# Patient Record
Sex: Female | Born: 1975 | Race: White | Hispanic: No | Marital: Married | Smoking: Never smoker
Health system: Southern US, Community
[De-identification: ages and names within clinical notes are randomized; demographics above are authoritative.]

## PROBLEM LIST (undated history)

## (undated) DIAGNOSIS — N289 Disorder of kidney and ureter, unspecified: Secondary | ICD-10-CM

## (undated) DIAGNOSIS — Z86718 Personal history of other venous thrombosis and embolism: Secondary | ICD-10-CM

## (undated) HISTORY — PX: GASTRIC BYPASS: SHX52

## (undated) HISTORY — PX: CHOLECYSTECTOMY: SHX55

---

## 2017-11-07 ENCOUNTER — Encounter (HOSPITAL_BASED_OUTPATIENT_CLINIC_OR_DEPARTMENT_OTHER): Payer: Self-pay | Admitting: *Deleted

## 2017-11-07 ENCOUNTER — Other Ambulatory Visit: Payer: Self-pay

## 2017-11-07 DIAGNOSIS — N390 Urinary tract infection, site not specified: Secondary | ICD-10-CM | POA: Insufficient documentation

## 2017-11-07 DIAGNOSIS — R319 Hematuria, unspecified: Secondary | ICD-10-CM | POA: Diagnosis present

## 2017-11-07 LAB — URINALYSIS, ROUTINE W REFLEX MICROSCOPIC
BILIRUBIN URINE: NEGATIVE
GLUCOSE, UA: NEGATIVE mg/dL
KETONES UR: 15 mg/dL — AB
Nitrite: NEGATIVE
PH: 6 (ref 5.0–8.0)
PROTEIN: NEGATIVE mg/dL
Specific Gravity, Urine: >1.03 — ABNORMAL HIGH (ref 1.005–1.030)

## 2017-11-07 LAB — URINALYSIS, MICROSCOPIC (REFLEX)

## 2017-11-07 LAB — PREGNANCY, URINE: Preg Test, Ur: NEGATIVE

## 2017-11-07 NOTE — ED Triage Notes (Signed)
Hematuria. Lower back pain in the center of her back and lower abdominal cramps in the center of her abdomen earlier this am.

## 2017-11-08 ENCOUNTER — Emergency Department (HOSPITAL_BASED_OUTPATIENT_CLINIC_OR_DEPARTMENT_OTHER): Payer: Medicare HMO

## 2017-11-08 ENCOUNTER — Emergency Department (HOSPITAL_BASED_OUTPATIENT_CLINIC_OR_DEPARTMENT_OTHER)
Admission: EM | Admit: 2017-11-08 | Discharge: 2017-11-08 | Disposition: A | Discharge status: Home / Self Care | Payer: Medicare HMO | Attending: Emergency Medicine | Admitting: Emergency Medicine

## 2017-11-08 DIAGNOSIS — N390 Urinary tract infection, site not specified: Secondary | ICD-10-CM

## 2017-11-08 HISTORY — DX: Disorder of kidney and ureter, unspecified: N28.9

## 2017-11-08 HISTORY — DX: Personal history of other venous thrombosis and embolism: Z86.718

## 2017-11-08 LAB — CBC WITH DIFFERENTIAL/PLATELET
Basophils Absolute: 0.1 10*3/uL (ref 0.0–0.1)
Basophils Relative: 1 %
EOS PCT: 4 %
Eosinophils Absolute: 0.4 10*3/uL (ref 0.0–0.7)
HEMATOCRIT: 37.8 % (ref 36.0–46.0)
Hemoglobin: 12.5 g/dL (ref 12.0–15.0)
LYMPHS PCT: 25 %
Lymphs Abs: 2.2 10*3/uL (ref 0.7–4.0)
MCH: 27.5 pg (ref 26.0–34.0)
MCHC: 33.1 g/dL (ref 30.0–36.0)
MCV: 83.1 fL (ref 78.0–100.0)
MONO ABS: 0.6 10*3/uL (ref 0.1–1.0)
Monocytes Relative: 7 %
NEUTROS ABS: 5.5 10*3/uL (ref 1.7–7.7)
Neutrophils Relative %: 63 %
PLATELETS: 234 10*3/uL (ref 150–400)
RBC: 4.55 MIL/uL (ref 3.87–5.11)
RDW: 14.2 % (ref 11.5–15.5)
WBC: 8.8 10*3/uL (ref 4.0–10.5)

## 2017-11-08 LAB — COMPREHENSIVE METABOLIC PANEL
ALBUMIN: 4.3 g/dL (ref 3.5–5.0)
ALK PHOS: 53 U/L (ref 38–126)
ALT: 18 U/L (ref 14–54)
AST: 18 U/L (ref 15–41)
Anion gap: 5 (ref 5–15)
BUN: 19 mg/dL (ref 6–20)
CALCIUM: 9 mg/dL (ref 8.9–10.3)
CHLORIDE: 104 mmol/L (ref 101–111)
CO2: 27 mmol/L (ref 22–32)
CREATININE: 1.01 mg/dL — AB (ref 0.44–1.00)
GFR calc Af Amer: >60 mL/min (ref >60)
GFR calc non Af Amer: >60 mL/min (ref >60)
GLUCOSE: 99 mg/dL (ref 65–99)
Potassium: 3.6 mmol/L (ref 3.5–5.1)
SODIUM: 136 mmol/L (ref 135–145)
Total Bilirubin: 0.9 mg/dL (ref 0.3–1.2)
Total Protein: 6.9 g/dL (ref 6.5–8.1)

## 2017-11-08 MED ORDER — TRAMADOL HCL 50 MG PO TABS: 50.0000 mg | ORAL_TABLET | Freq: Four times a day (QID) | ORAL | 0 refills | Status: AC | PRN

## 2017-11-08 MED ORDER — FENTANYL CITRATE (PF) 100 MCG/2ML IJ SOLN
50.0000 ug | Freq: Once | INTRAMUSCULAR | Status: AC
Start: 2017-11-08 — End: 2017-11-08
  Administered 2017-11-08: 50 ug via INTRAVENOUS
  Filled 2017-11-08: qty 2

## 2017-11-08 MED ORDER — ONDANSETRON 4 MG PO TBDP: 4.0000 mg | ORAL_TABLET | Freq: Four times a day (QID) | ORAL | 0 refills | Status: AC | PRN

## 2017-11-08 MED ORDER — SODIUM CHLORIDE 0.9 % IV BOLUS (SEPSIS)
1000.0000 mL | Freq: Once | INTRAVENOUS | Status: AC
  Administered 2017-11-08: 1000 mL via INTRAVENOUS

## 2017-11-08 MED ORDER — FENTANYL CITRATE (PF) 100 MCG/2ML IJ SOLN
INTRAMUSCULAR | Status: AC
  Filled 2017-11-08: qty 2

## 2017-11-08 MED ORDER — ONDANSETRON HCL 4 MG/2ML IJ SOLN
4.0000 mg | Freq: Once | INTRAMUSCULAR | Status: AC
  Administered 2017-11-08: 4 mg via INTRAVENOUS
  Filled 2017-11-08: qty 2

## 2017-11-08 MED ORDER — KETOROLAC TROMETHAMINE 30 MG/ML IJ SOLN
30.0000 mg | Freq: Once | INTRAMUSCULAR | Status: AC
  Administered 2017-11-08: 30 mg via INTRAVENOUS
  Filled 2017-11-08: qty 1

## 2017-11-08 MED ORDER — CEPHALEXIN 500 MG PO CAPS: 500.0000 mg | ORAL_CAPSULE | Freq: Three times a day (TID) | ORAL | 0 refills | Status: AC

## 2017-11-08 MED ORDER — PHENAZOPYRIDINE HCL 95 MG PO TABS: 95.0000 mg | ORAL_TABLET | Freq: Three times a day (TID) | ORAL | 0 refills | Status: AC | PRN

## 2017-11-08 MED ORDER — SODIUM CHLORIDE 0.9 % IV SOLN
1.0000 g | Freq: Once | INTRAVENOUS | Status: AC
  Administered 2017-11-08: 1 g via INTRAVENOUS
  Filled 2017-11-08: qty 10

## 2017-11-08 MED ORDER — FENTANYL CITRATE (PF) 100 MCG/2ML IJ SOLN
50.0000 ug | Freq: Once | INTRAMUSCULAR | Status: AC
  Administered 2017-11-08: 50 ug via INTRAVENOUS

## 2017-11-08 NOTE — ED Provider Notes (Addendum)
TIME SEEN: 12:28 AM  CHIEF COMPLAINT: Dysuria, hematuria, back pain  HPI: Patient is a 42 year old female with no significant past medical history who presents to the emergency department today with complaints of intermittent sharp flank pain and now dysuria, urinary frequency and urgency, gross hematuria.  States now she is also having severe low back pain bilaterally.  Has had urinary tract infections before and has been told that she has possibly had a kidney stone but states she never had confirmatory imaging.  She has had nausea and retching but no vomiting or diarrhea.  No abdominal pain.  No fever but has had chills.  Status post cholecystectomy, 2 C-sections, laparotomy and previous gastric bypass.  ROS: See HPI Constitutional: no fever  Eyes: no drainage  ENT: no runny nose   Cardiovascular:  no chest pain  Resp: no SOB  GI: no vomiting GU:  dysuria Integumentary: no rash  Allergy: no hives  Musculoskeletal: no leg swelling  Neurological: no slurred speech ROS otherwise negative  PAST MEDICAL HISTORY/PAST SURGICAL HISTORY:  Past Medical History:  Diagnosis Date  . H/O blood clots   . Renal disorder     MEDICATIONS:  Prior to Admission medications   Medication Sig Start Date End Date Taking? Authorizing Provider  Dalteparin Sodium (FRAGMIN [place]) Inject into the skin.   Yes [provider]  DULoxetine HCl (CYMBALTA PO) Take by mouth.   Yes [provider]  Pantoprazole Sodium (PROTONIX PO) Take by mouth.   Yes [provider]    ALLERGIES:  Allergies  Allergen Reactions  . Ketamine Anaphylaxis  . Ancef [Cefazolin]     hives  . Ativan [Lorazepam]     disoriented  . Morphine And Related     itching  . Nsaids     Gastric bypass  . Nubain [Nalbuphine]     disoriented    SOCIAL HISTORY:  Social History   Tobacco Use  . Smoking status: Never Smoker  . Smokeless tobacco: Never Used  Substance Use Topics  . Alcohol use: No   Frequency: Never    FAMILY HISTORY: No family history on file.  EXAM: BP 108/72   Pulse 86   Temp 98.2 F (36.8 C) (Oral)   Resp 20   Ht 5\' 10"  (1.778 m)   Wt 120.2 kg (265 lb)   LMP 11/01/2017   SpO2 100%   BMI 38.02 kg/m  CONSTITUTIONAL: Alert and oriented and responds appropriately to questions. Well-appearing; well-nourished HEAD: Normocephalic EYES: Conjunctivae clear, pupils appear equal, EOMI ENT: normal nose; moist mucous membranes NECK: Supple, no meningismus, no nuchal rigidity, no LAD  CARD: RRR; S1 and S2 appreciated; no murmurs, no clicks, no rubs, no gallops RESP: Normal chest excursion without splinting or tachypnea; breath sounds clear and equal bilaterally; no wheezes, no rhonchi, no rales, no hypoxia or respiratory distress, speaking full sentences ABD/GI: Normal bowel sounds; non-distended; soft, non-tender, no rebound, no guarding, no peritoneal signs, no hepatosplenomegaly BACK:  The back appears normal and is non-tender to palpation, there is no CVA tenderness, no midline spinal tenderness or step-off or deformity EXT: Normal ROM in all joints; non-tender to palpation; no edema; normal capillary refill; no cyanosis, no calf tenderness or swelling    SKIN: Normal color for age and race; warm; no rash NEURO: Moves all extremities equally PSYCH: The patient's mood and manner are appropriate. Grooming and personal hygiene are appropriate.  MEDICAL DECISION MAKING: Patient here with back pain and urinary symptoms.  Differential includes  pyelonephritis, kidney stone.  Less likely musculoskeletal in nature.  Abdominal exam is benign.  Doubt appendicitis, colitis, bowel obstruction.  Will obtain labs, CT of her abdomen pelvis without contrast to evaluate for possible stone.  Urine does appear infected.  We will send urine culture.  She states previous urinary tract infections have been treated well with Keflex.  States she had allergy to Ancef but is able to tolerate  other cephalosporins.  States when she had Ancef she had itching in the arm where the IV was placed.  No rash.  No lip or tongue swelling.  Will give Toradol, Zofran for symptomatic relief and reassess.   ED PROGRESS: Patient's labs are reassuring.  CT scan shows that there is a punctate stone in the mid right kidney but no hydronephrosis or ureteral stone.  Otherwise CT scan is unremarkable.  I feel she is safe to be discharged home on Keflex.  She has not had any reaction to Rocephin here.  Urine culture is pending.  Will discharge with prescriptions for Keflex, Pyridium, short course of tramadol for further pain control, Zofran.  At this time, I do not feel there is any life-threatening condition present. I have reviewed and discussed all results (EKG, imaging, lab, urine as appropriate) and exam findings with patient/family. I have reviewed nursing notes and appropriate previous records.  I feel the patient is safe to be discharged home without further emergent workup and can continue workup as an outpatient as needed. Discussed usual and customary return precautions. Patient/family verbalize understanding and are comfortable with this plan.  Outpatient follow-up has been provided if needed. All questions have been answered.       Layton Tappan, Layla Maw, DO 11/08/17 [phone]

## 2017-11-08 NOTE — Discharge Instructions (Signed)
You may alternate Tylenol 1000 mg every 6 hours as needed for pain and Ibuprofen 800 mg every 8 hours as needed for pain.  Please take Ibuprofen with food. ° °

## 2017-11-09 LAB — URINE CULTURE

## 2019-03-23 IMAGING — CT CT RENAL STONE PROTOCOL
2 of 4 series · 16 of 46 positions shown, 18 images · non-contrast
Comparison: None.

CLINICAL DATA: Flank pain with low back pain and hematuria

EXAM:
CT ABDOMEN AND PELVIS WITHOUT CONTRAST
TECHNIQUE: Multidetector CT imaging of the abdomen and pelvis was performed
following the standard protocol without IV contrast.

[Series 2: axial st · axial · 0.85mm/px · z∈[-544,-69]mm · 13 of 105 slices shown, 15 images]
[im 5/105  soft-tissue]
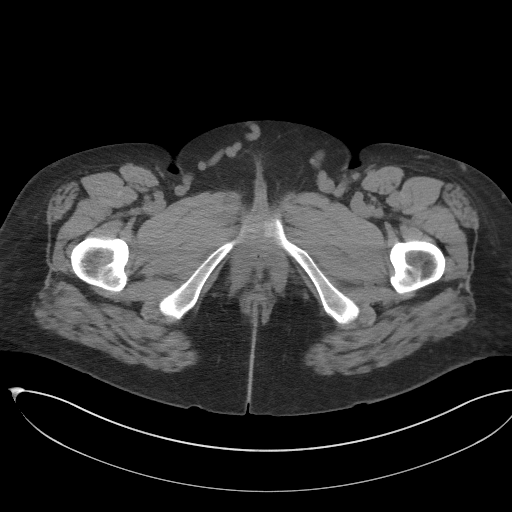
[im 5/105  bone]
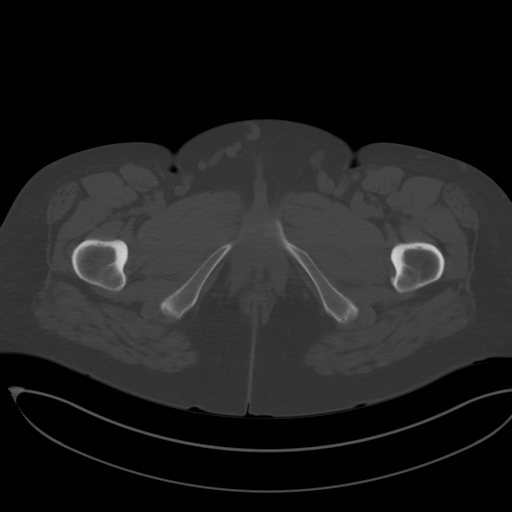
[im 14/105  soft-tissue]
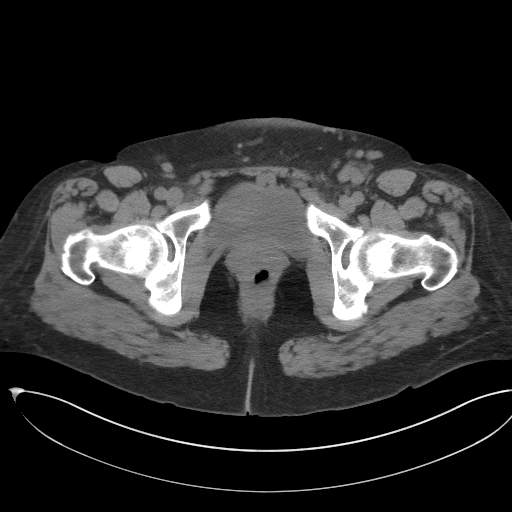
[im 23/105  soft-tissue]
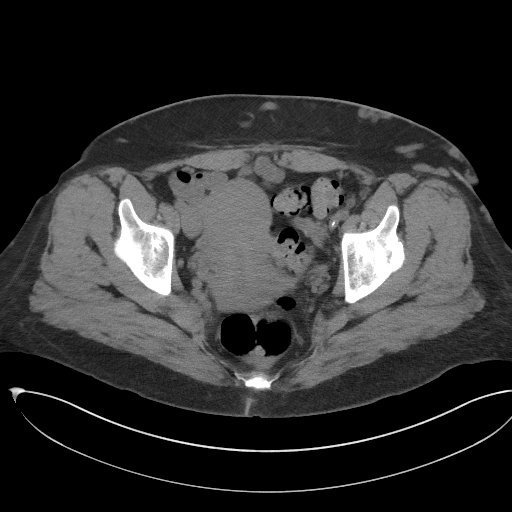
[im 28/105  soft-tissue]
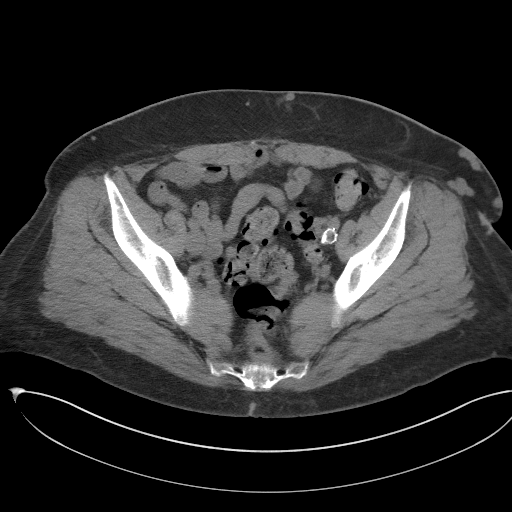
[im 37/105  soft-tissue]
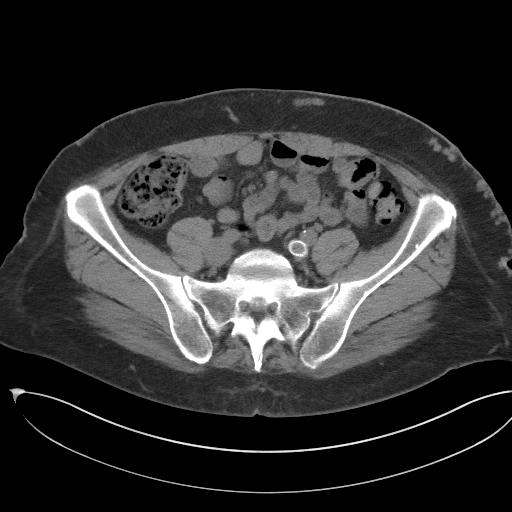
[im 46/105  soft-tissue]
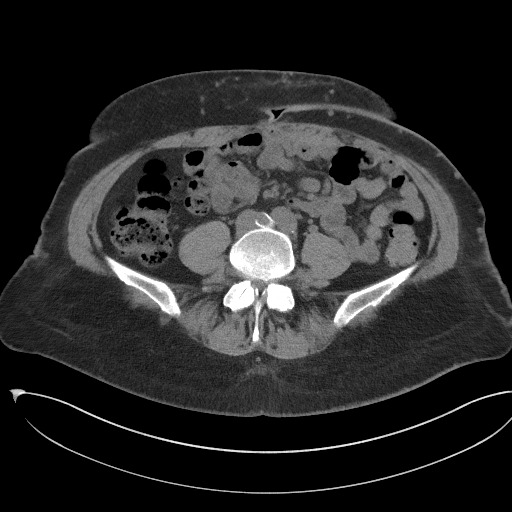
[im 55/105  soft-tissue]
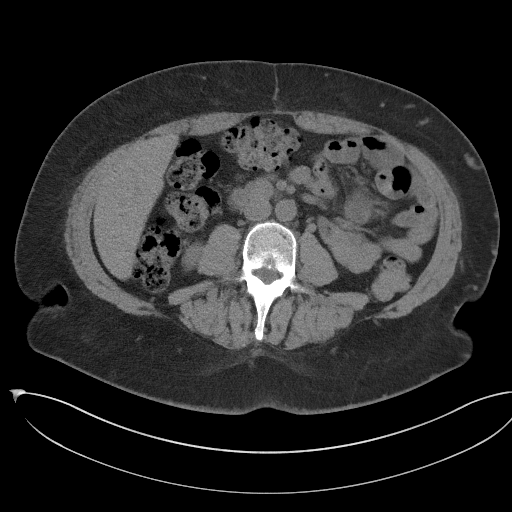
[im 59/105  soft-tissue]
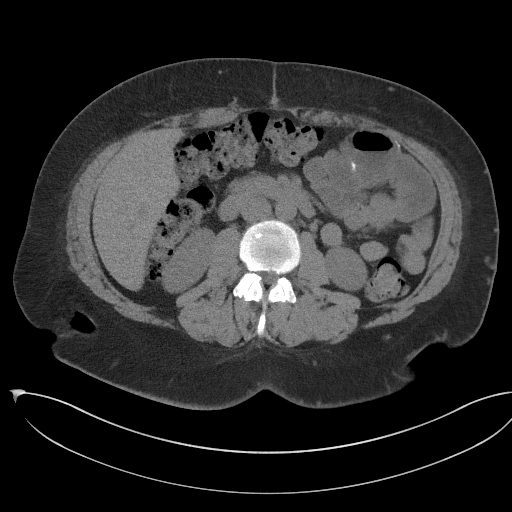
[im 68/105  soft-tissue]
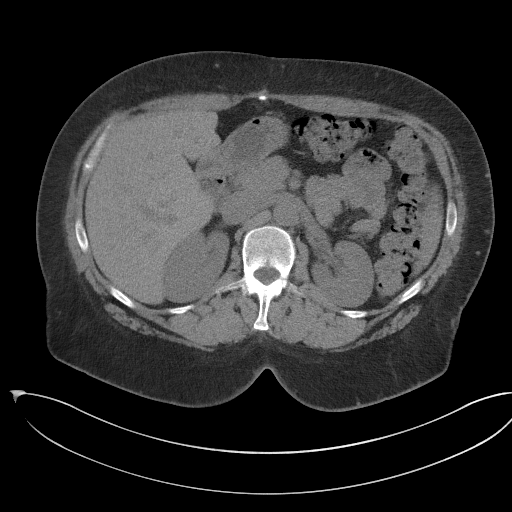
[im 68/105  bone]
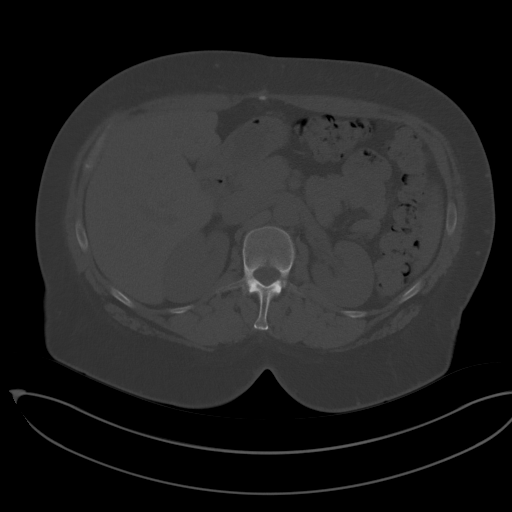
[im 77/105  soft-tissue]
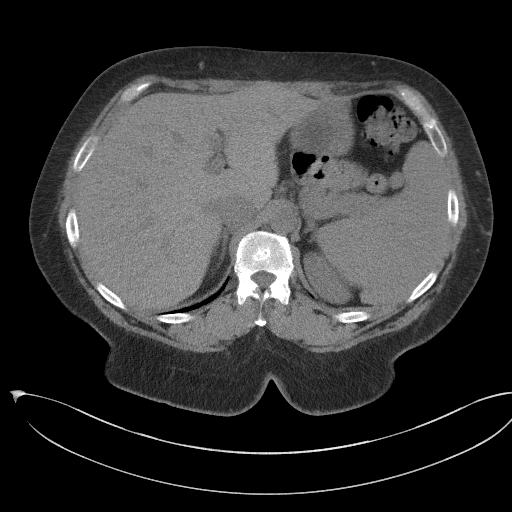
[im 82/105  soft-tissue]
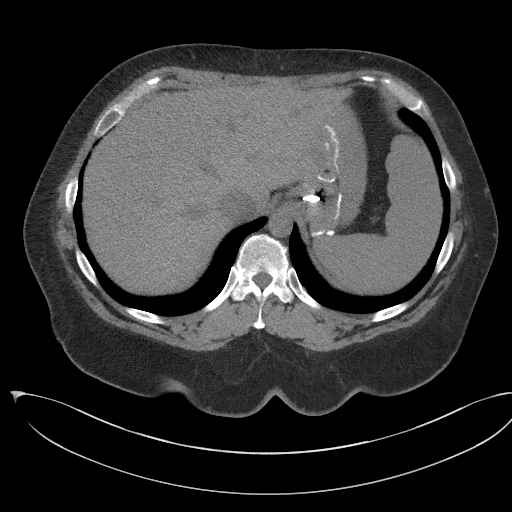
[im 91/105  soft-tissue]
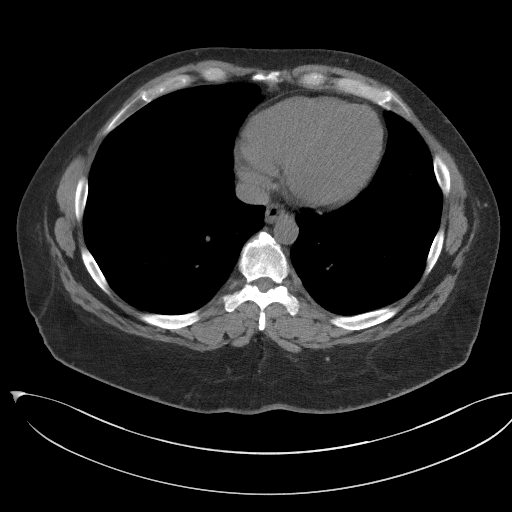
[im 100/105  soft-tissue]
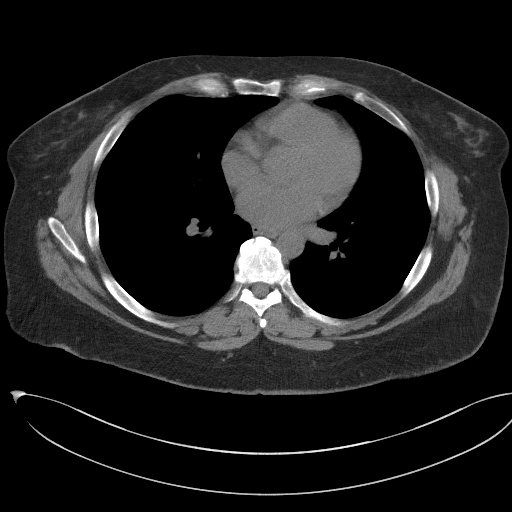

[Series 4: coronal st · coronal · 0.94mm/px · 3 of 93 slices shown]
[im 31/93  soft-tissue]
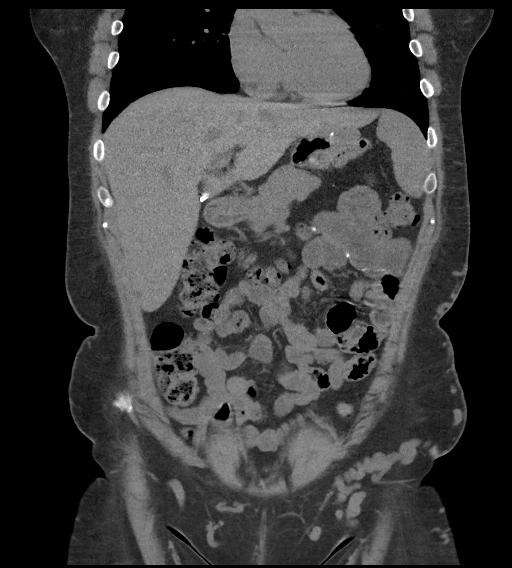
[im 41/93  soft-tissue]
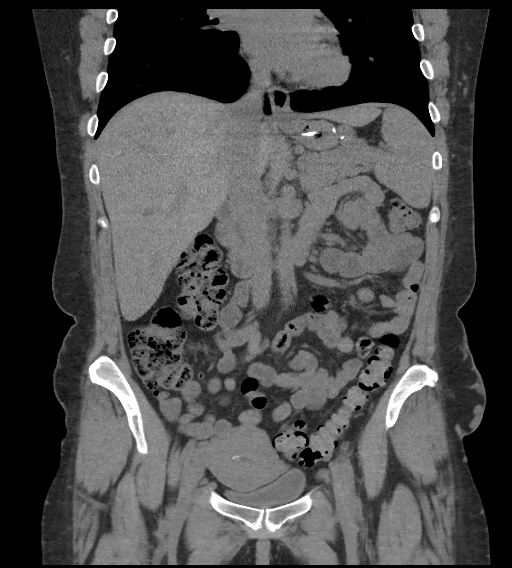
[im 52/93  soft-tissue]
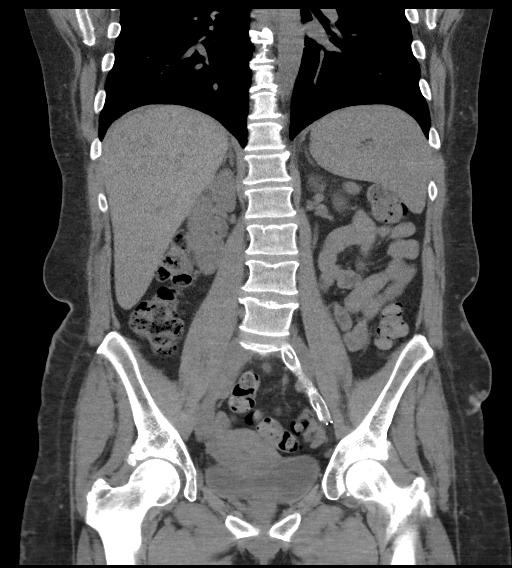

[16 of 46 positions shown; findings below may reference images not displayed]

FINDINGS: Lower chest: Lung bases demonstrate no acute consolidation or
pleural effusion. 4 mm subpleural left lower lobe lung nodule,
series 3, image number 16. Normal heart size.

Hepatobiliary: No focal liver abnormality is seen. Status post
cholecystectomy. No biliary dilatation.

Pancreas: Unremarkable. No pancreatic ductal dilatation or
surrounding inflammatory changes.

Spleen: Borderline to slightly enlarged.

Adrenals/Urinary Tract: Adrenal glands are within normal limits.
Faint punctate stone mid right kidney on coronal views. No
hydronephrosis. No ureteral stone. Bladder unremarkable

Stomach/Bowel: Status post gastric bypass. No dilated small bowel to
suggest obstruction. No colon wall thickening. Normal appendix.
Fairly extensive stool in the colon

Vascular/Lymphatic: Nonaneurysmal aorta. Stent within the left iliac
vein with intraluminal calcifications. Multiple tortuous superficial
vessels within the anterior abdominal wall. No significantly
enlarged lymph nodes.

Reproductive: Small parenchymal calcifications in the uterus. No
adnexal mass.

Other: Negative for free air or free fluid.

Musculoskeletal: No acute or significant osseous findings.
IMPRESSION: 1. Negative for hydronephrosis or ureteral stone. Punctate stone in
the mid right kidney.
2. Borderline enlargement of the spleen
3. Status post gastric bypass surgery without evidence for
obstruction or bowel wall thickening
